# Patient Record
Sex: Male | Born: 1970 | Race: White | Hispanic: No | Marital: Married | Smoking: Never smoker
Health system: Southern US, Community
[De-identification: ages and names within clinical notes are randomized; demographics above are authoritative.]

## PROBLEM LIST (undated history)

## (undated) DIAGNOSIS — I10 Essential (primary) hypertension: Secondary | ICD-10-CM

---

## 2000-12-26 ENCOUNTER — Emergency Department (HOSPITAL_COMMUNITY): Admission: EM | Admit: 2000-12-26 | Discharge: 2000-12-26 | Payer: Self-pay | Admitting: Emergency Medicine

## 2008-01-29 ENCOUNTER — Emergency Department (HOSPITAL_COMMUNITY): Admission: EM | Admit: 2008-01-29 | Discharge: 2008-01-29 | Payer: Self-pay | Admitting: Emergency Medicine

## 2008-02-03 ENCOUNTER — Ambulatory Visit: Payer: Self-pay | Admitting: Cardiology

## 2009-03-12 IMAGING — CR DG CHEST 2V
2 series · 2 of 2 positions shown · non-contrast
Comparison: none

CLINICAL DATA: Left-sided chest pain.
 CHEST - 2 VIEW:

[w chest pa *]
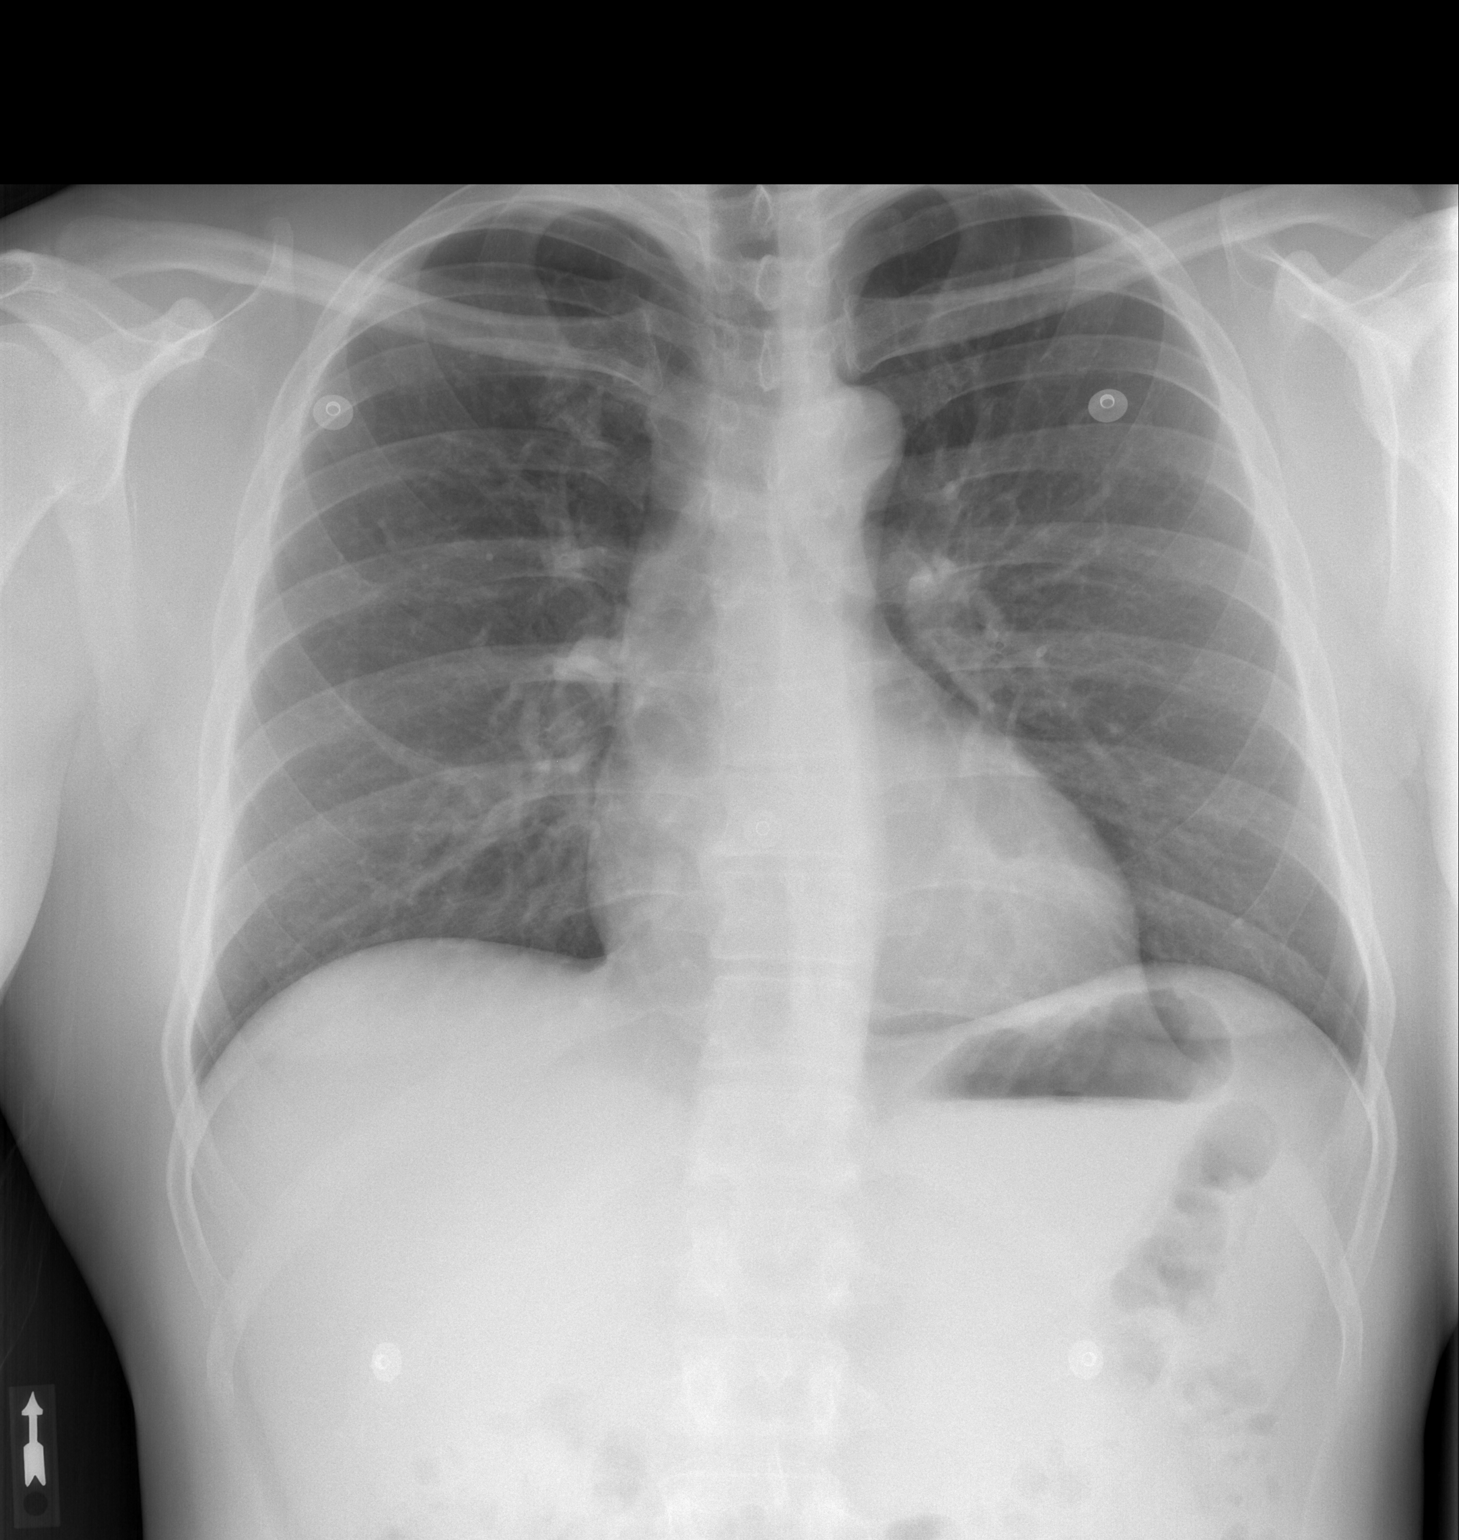

[w chest lat *]
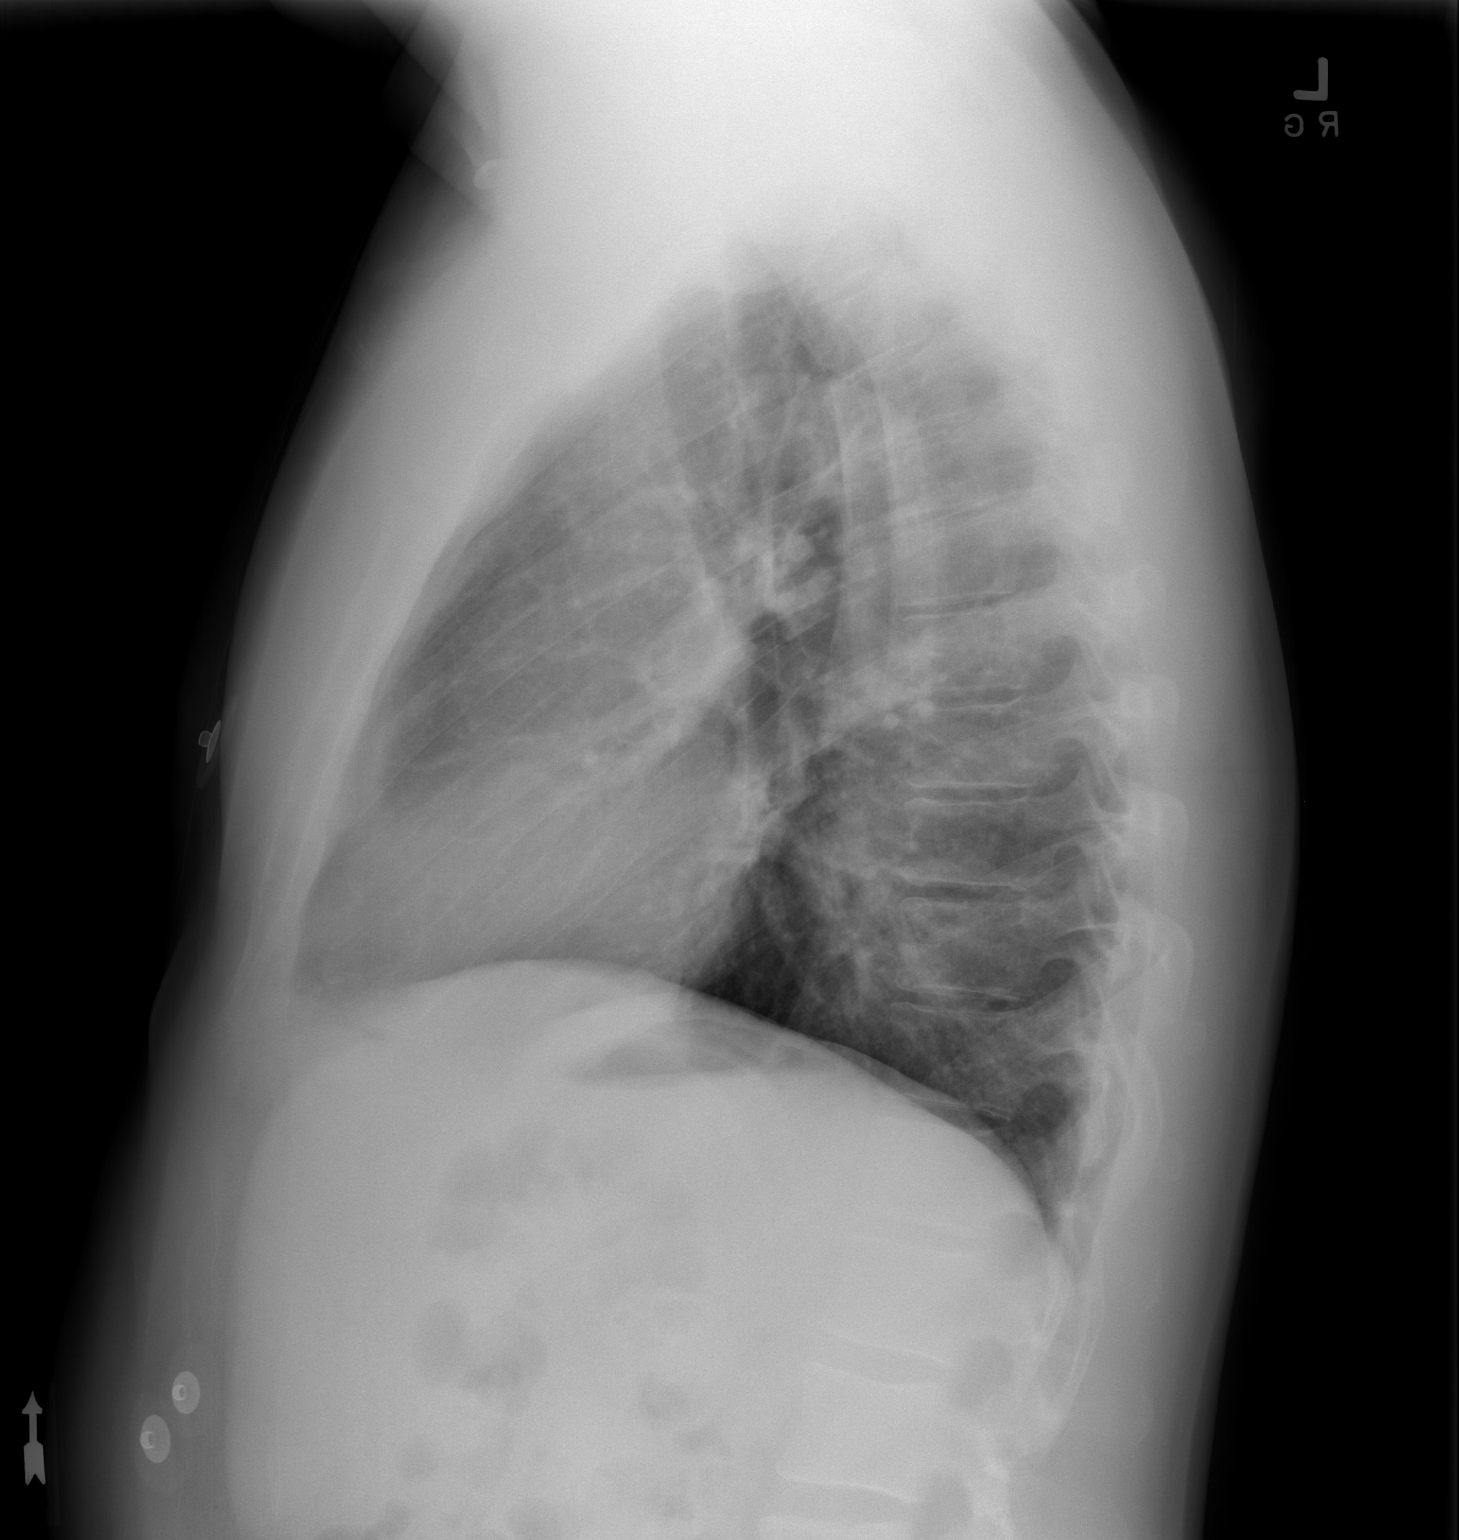

[2 of 2 positions shown; findings below may reference images not displayed]

FINDINGS: The heart size and mediastinal contours are within normal limits.  Both lungs are clear.  The visualized skeletal structures are unremarkable.
IMPRESSION: No active cardiopulmonary disease.

## 2011-05-12 NOTE — Assessment & Plan Note (Signed)
[facility]                            CARDIOLOGY OFFICE NOTE   NAME:Brownfield, OMER                          MRN:          [id number]  DATE:02/03/2008                            DOB:          13-Mar-1971    PRIMARY:  Dr. Lorre Nick.   REASON FOR PRESENTATION:  The patient with chest pain.   HISTORY OF PRESENT ILLNESS:  Patient is a  very pleasant 40 year old  white gentleman without prior cardiac history.  For 32 years, he has had  chest discomfort.  Happens about once a year.  He describes a gripping  discomfort that it is throbbing and under his left breast.  It will last  only for a few beats and then go away.  May not recur for an entire  year.  This has been a stable pattern until last Thursday.  He had the  same kind of discomfort.  It was much more intense.  It was coming and  going much more frequently.  This happened over 4 days.  He described an  intense discomfort that was throbbing.  It would last for a few to  several seconds at a time.  His wife said he looked very uncomfortable  and actually dropped what he was doing.  He did not have any  palpitations with this.  He he did not have any presyncope or syncope.  He presented to the emergency room where he was not found to have any  arrhythmias or elevated enzymes.  There were no objective findings.  He  was referred here.  It seems to be easing just a little bit.  He is  quite active.  He exercises vigorously.  He cannot bring these on.  He  does not have any shortness of breath, PND or orthopnea.  He has no  radiation to his jaw or to his arms.  It goes away spontaneously.  Happens without provocation..  It is not positional.  It has nothing to  do with inspiration.   PAST MEDICAL HISTORY:  He has no history of hypertension, diabetes or  hyperlipidemia.   PAST SURGICAL HISTORY:  None.   ALLERGIES:  None.   MEDICATIONS:  None.   SOCIAL HISTORY:  Does not smoke cigarettes and  never has.  He  occasionally drinks alcohol but rarely.  He is an Investment banker, corporate.  Married.  Has no children.  He does have horses.   FAMILY HISTORY:  Is contributory for hypertension but no palpitations,  heart failure, coronary disease early.   REVIEW OF SYSTEMS:  As stated in HPI and otherwise negative  for all  other systems.   PHYSICAL EXAMINATION:  The patient is in no distress.  Blood pressure  146/97, heart rate 71 and regular, weight 174 pounds, body mass index  27.  HEENT:  Eyelids unremarkable, pupils equal, round, reactive to light,  fundi not visualized, oral mucosa unremarkable.  NECK:  No jugular venous distention at 45 degrees.  Carotid upstroke  brisk and symmetrical.  No bruits, thyromegaly.  LYMPHATICS:  No cervical, axillary, inguinal adenopathy.  LUNGS:  Clear to auscultation bilaterally.  BACK:  No costovertebral angle tenderness..  CHEST:  Unremarkable.  HEART:  PMI not displaced or sustained, S1-S2 within normal limits.  No  S3-S4, no clicks, rubs, murmurs.  ABDOMEN:  Flat, positive bowel sounds normal in frequency, pitch, no  bruits, rebound, guarding or midline pulsatile mass, no  hepatosplenomegaly.  SKIN:  No rashes  EXTREMITIES:  2+ pulses throughout, no edema, cyanosis, clubbing.  NEURO:  Oriented to person, place, time, cranial nerves II-XII grossly  intact, motor grossly intact.   EKG:  Sinus rhythm, rate 78, axis within normal limits.  Intervals  within normal limits, no acute ST wave change.   ASSESSMENT/PLAN:  1. Chest discomfort.  The patient's chest discomfort is atypical.  I      do not think it is obstructive coronary disease.  I did discuss      with him the possibility this could be an arrhythmia.  He is going      to get a 24-hour Holter monitor.  Also check a TSH, BMET, and      magnesium.  2. Hypertension.  Blood pressure is slightly elevated.  We discussed      therapeutic lifestyle changes.  He is going to get a blood  pressure      cuff and bring it here to correlate it and  keep a blood pressure      diary.  He may need treatment for this.  3. The weight. His body mass index puts him overweight but his      physical exam demonstrates excellent fitness.  I would not worry      about the body mass index in this situation.  4. Follow up. I will seem him back in a couple weeks.Rollene Rotunda, MD, [facility]  Electronically Signed    JH/MedQ  DD: 02/03/2008  DT: 02/05/2008  Job #: [id number]

## 2011-09-18 LAB — POCT CARDIAC MARKERS
CKMB, poc: <1 — ABNORMAL LOW
Myoglobin, poc: 40.9
Operator id: 4074
Troponin i, poc: <0.05

## 2018-06-13 ENCOUNTER — Encounter (HOSPITAL_BASED_OUTPATIENT_CLINIC_OR_DEPARTMENT_OTHER): Payer: Self-pay | Admitting: Emergency Medicine

## 2018-06-13 ENCOUNTER — Other Ambulatory Visit: Payer: Self-pay

## 2018-06-13 ENCOUNTER — Emergency Department (HOSPITAL_BASED_OUTPATIENT_CLINIC_OR_DEPARTMENT_OTHER)
Admission: EM | Admit: 2018-06-13 | Discharge: 2018-06-14 | Disposition: A | Discharge status: Home / Self Care | Payer: BC Managed Care – PPO | Attending: Physician Assistant | Admitting: Physician Assistant

## 2018-06-13 DIAGNOSIS — I10 Essential (primary) hypertension: Secondary | ICD-10-CM | POA: Insufficient documentation

## 2018-06-13 DIAGNOSIS — Z79899 Other long term (current) drug therapy: Secondary | ICD-10-CM | POA: Diagnosis not present

## 2018-06-13 DIAGNOSIS — T782XXA Anaphylactic shock, unspecified, initial encounter: Secondary | ICD-10-CM | POA: Insufficient documentation

## 2018-06-13 DIAGNOSIS — T7840XA Allergy, unspecified, initial encounter: Secondary | ICD-10-CM | POA: Diagnosis present

## 2018-06-13 DIAGNOSIS — T63461A Toxic effect of venom of wasps, accidental (unintentional), initial encounter: Secondary | ICD-10-CM | POA: Insufficient documentation

## 2018-06-13 HISTORY — DX: Essential (primary) hypertension: I10

## 2018-06-13 MED ORDER — EPINEPHRINE 0.3 MG/0.3ML IJ SOAJ
INTRAMUSCULAR | Status: AC
  Administered 2018-06-13: 0.3 mg via INTRAMUSCULAR
  Filled 2018-06-13: qty 0.3

## 2018-06-13 MED ORDER — METHYLPREDNISOLONE SODIUM SUCC 125 MG IJ SOLR
125.0000 mg | Freq: Once | INTRAMUSCULAR | Status: AC
  Administered 2018-06-13: 125 mg via INTRAVENOUS

## 2018-06-13 MED ORDER — EPINEPHRINE 0.3 MG/0.3ML IJ SOAJ
0.3000 mg | Freq: Once | INTRAMUSCULAR | Status: AC
  Administered 2018-06-13: 0.3 mg via INTRAMUSCULAR

## 2018-06-13 MED ORDER — DIPHENHYDRAMINE HCL 50 MG/ML IJ SOLN
INTRAMUSCULAR | Status: AC
  Administered 2018-06-13: 25 mg via INTRAVENOUS
  Filled 2018-06-13: qty 1

## 2018-06-13 MED ORDER — METHYLPREDNISOLONE SODIUM SUCC 125 MG IJ SOLR
INTRAMUSCULAR | Status: AC
  Administered 2018-06-13: 125 mg via INTRAVENOUS
  Filled 2018-06-13: qty 2

## 2018-06-13 MED ORDER — DIPHENHYDRAMINE HCL 50 MG/ML IJ SOLN
25.0000 mg | Freq: Once | INTRAMUSCULAR | Status: AC
  Administered 2018-06-13: 25 mg via INTRAVENOUS

## 2018-06-13 MED ORDER — PREDNISONE 10 MG PO TABS
40.0000 mg | ORAL_TABLET | Freq: Every day | ORAL | 0 refills | Status: AC
Start: 2018-06-13 — End: 2018-06-17

## 2018-06-13 MED ORDER — FAMOTIDINE IN NACL 20-0.9 MG/50ML-% IV SOLN
INTRAVENOUS | Status: AC
  Administered 2018-06-13: 20 mg via INTRAVENOUS
  Filled 2018-06-13: qty 50

## 2018-06-13 MED ORDER — FAMOTIDINE IN NACL 20-0.9 MG/50ML-% IV SOLN
20.0000 mg | Freq: Once | INTRAVENOUS | Status: AC
Start: 2018-06-13 — End: 2018-06-13
  Administered 2018-06-13: 20 mg via INTRAVENOUS

## 2018-06-13 MED ORDER — EPINEPHRINE 0.3 MG/0.3ML IJ SOAJ: 0.3000 mg | Freq: Once | INTRAMUSCULAR | 0 refills | Status: AC

## 2018-06-13 NOTE — ED Triage Notes (Signed)
Patient was stung by a brown hornet around 545 pm to his left calf - patient states that he started to have extreme itching and feeling like he was going to pass out. The patient reports that his lips are numb - denies any trouble swallowing or SOB

## 2018-06-13 NOTE — Discharge Instructions (Addendum)
We will need to carry this EpiPen with you at all times.  Please follow-up with your primary care, you may need to get allergy testing.

## 2018-06-13 NOTE — ED Provider Notes (Signed)
[facility] EMERGENCY DEPARTMENT Provider Note   CSN: [id number] Arrival date & time: 06/13/18  1839     History   Chief Complaint Chief Complaint  Patient presents with  . Allergic Reaction    HPI Aydrien Froman is a 47 y.o. male.  HPI   Patient is a 47 year old male presenting after a wasp sting.  Patient had a wasp sting at 6:30 PM.  Patient reports she started having itchiness.  Went home and his wife gave him 25 mg of Benadryl.  Patient felt tingling in his lips.  Patient almost passed out.  Patient's wife took his blood pressure and noticed it to be hypotensive.  Patient then came to the emergency department.  He reports that his tingling in his lips have not gotten any worse nor any better.  He reports the itching has gotten mildly better.  Patient is never had allergic reaction in the past.  Past Medical History:  Diagnosis Date  . Hypertension     There are no active problems to display for this patient.   History reviewed. No pertinent surgical history.      Home Medications    Prior to Admission medications   Medication Sig Start Date End Date Taking? Authorizing Provider  amLODipine (NORVASC) 10 MG tablet Take 10 mg by mouth daily.   Yes [provider]  losartan (COZAAR) 50 MG tablet Take 50 mg by mouth daily.   Yes [provider]    Family History History reviewed. No pertinent family history.  Social History Social History   Tobacco Use  . Smoking status: Never Smoker  . Smokeless tobacco: Never Used  Substance Use Topics  . Alcohol use: Never    Frequency: Never  . Drug use: Never     Allergies   Patient has no known allergies.   Review of Systems Review of Systems  Constitutional: Negative for activity change.  Respiratory: Negative for shortness of breath.   Cardiovascular: Negative for chest pain.  Gastrointestinal: Negative for abdominal pain.  Skin: Positive for rash.  All other systems reviewed and  are negative.    Physical Exam Updated Vital Signs BP 138/81 (BP Location: Left Arm)   Pulse (!) 58   Temp (!) 97 F (36.1 C) (Oral)   Resp 18   Ht 5\' 6"  (1.676 m)   Wt 79.4 kg (175 lb)   SpO2 100%   BMI 28.25 kg/m   Physical Exam  Constitutional: He is oriented to person, place, and time. He appears well-nourished.  HENT:  Head: Normocephalic.  No uvula swelling,  Eyes: Conjunctivae are normal.  Cardiovascular: Normal rate and regular rhythm.  Pulmonary/Chest: Effort normal and breath sounds normal. No respiratory distress.  Abdominal: Soft. He exhibits no distension. There is no tenderness.  Neurological: He is oriented to person, place, and time.  Skin: Skin is warm and dry. He is not diaphoretic.  Hive-like rash bilateral upper extremities and chest.  Psychiatric: He has a normal mood and affect. His behavior is normal.     ED Treatments / Results  Labs (all labs ordered are listed, but only abnormal results are displayed) Labs Reviewed - No data to display  EKG None  Radiology No results found.  Procedures Procedures (including critical care time)  CRITICAL CARE Performed by: Arlana Hove Total critical care time: 45 minutes Critical care time was exclusive of separately billable procedures and treating other patients. Critical care was necessary to treat or prevent imminent or  life-threatening deterioration. Critical care was time spent personally by me on the following activities: development of treatment plan with patient and/or surrogate as well as nursing, discussions with consultants, evaluation of patient's response to treatment, examination of patient, obtaining history from patient or surrogate, ordering and performing treatments and interventions, ordering and review of laboratory studies, ordering and review of radiographic studies, pulse oximetry and re-evaluation of patient's condition.   Medications Ordered in ED Medications    EPINEPHrine (EPI-PEN) injection 0.3 mg (has no administration in time range)  diphenhydrAMINE (BENADRYL) injection 25 mg (has no administration in time range)  famotidine (PEPCID) IVPB 20 mg premix (has no administration in time range)  methylPREDNISolone sodium succinate (SOLU-MEDROL) 125 mg/2 mL injection 125 mg (has no administration in time range)     Initial Impression / Assessment and Plan / ED Course  I have reviewed the triage vital signs and the nursing notes.  Pertinent labs & imaging results that were available during my care of the patient were reviewed by me and considered in my medical decision making (see chart for details).      Patient is a 47 year old male presenting after a wasp sting.  Patient had a wasp sting at 6:30 PM.  Patient reports she started having itchiness.  Went home and his wife gave him 25 mg of Benadryl.  Patient felt tingling in his lips.  Patient almost passed out.  Patient's wife took his blood pressure and noticed it to be hypotensive.  Patient then came to the emergency department.  He reports that his tingling in his lips have not gotten any worse nor any better.  He reports the itching has gotten mildly better.  Patient is never had allergic reaction in the past.  7:04 PM Will give epi, steroids, benadryl, pecid  Symptoms improved.  12:00 AM Symptoms still resolved, will discharge with epi pen, steroid burt and follow up with PCP.   Final Clinical Impressions(s) / ED Diagnoses   Final diagnoses:  None    ED Discharge Orders    None       Shataria Crist Lyn, MD 06/14/18 0000

## 2018-06-13 NOTE — ED Notes (Signed)
Pt on monitor 

## 2020-04-10 ENCOUNTER — Ambulatory Visit (INDEPENDENT_AMBULATORY_CARE_PROVIDER_SITE_OTHER): Payer: BC Managed Care – PPO

## 2020-04-10 ENCOUNTER — Other Ambulatory Visit: Payer: Self-pay

## 2020-04-10 ENCOUNTER — Ambulatory Visit: Payer: BC Managed Care – PPO | Admitting: Podiatry

## 2020-04-10 VITALS — Temp 97.9°F

## 2020-04-10 DIAGNOSIS — M7751 Other enthesopathy of right foot: Secondary | ICD-10-CM

## 2020-04-10 DIAGNOSIS — M778 Other enthesopathies, not elsewhere classified: Secondary | ICD-10-CM

## 2020-04-10 DIAGNOSIS — M79671 Pain in right foot: Secondary | ICD-10-CM

## 2020-04-10 MED ORDER — DICLOFENAC SODIUM 75 MG PO TBEC: 75.0000 mg | DELAYED_RELEASE_TABLET | Freq: Two times a day (BID) | ORAL | 1 refills | Status: DC

## 2020-04-11 ENCOUNTER — Telehealth: Payer: Self-pay | Admitting: *Deleted

## 2020-04-11 DIAGNOSIS — S92901G Unspecified fracture of right foot, subsequent encounter for fracture with delayed healing: Secondary | ICD-10-CM

## 2020-04-11 NOTE — Telephone Encounter (Signed)
-----   Message from Felecia Shelling, DPM sent at 04/10/2020  3:26 PM EDT ----- Regarding: MRI RT foot Please order MRI RT foot w/out contrast.   Dx: chronic foot pain RT x 9 mos. Possible stress fx RT. Tendinitis RT.   Thanks, Dr. Logan Bores

## 2020-04-11 NOTE — Telephone Encounter (Signed)
Faxed orders to [facility], given to H. Zonia Kief, CMA for pre-cert.

## 2020-04-16 ENCOUNTER — Other Ambulatory Visit: Payer: Self-pay | Admitting: Podiatry

## 2020-04-16 DIAGNOSIS — M778 Other enthesopathies, not elsewhere classified: Secondary | ICD-10-CM

## 2020-04-17 NOTE — Progress Notes (Signed)
   HPI: 49 y.o. male presenting today as a new patient with a chief complaint of right medial midfoot and heel pain that began about 9 months ago. He has been diagnosed with plantar fasciitis in the past and has received injections with minimal relief. Working and running increases the pain. He has also taken Ibuprofen, done heel stretches and used Aspercreme with minimal relief. He has been using an ankle brace as well. Patient is here for further evaluation and treatment.   Past Medical History:  Diagnosis Date  . Hypertension      Physical Exam: General: The patient is alert and oriented x3 in no acute distress.  Dermatology: Skin is warm, dry and supple bilateral lower extremities. Negative for open lesions or macerations.  Vascular: Palpable pedal pulses bilaterally. No edema or erythema noted. Capillary refill within normal limits.  Neurological: Epicritic and protective threshold grossly intact bilaterally.   Musculoskeletal Exam: Pain with palpation noted to the right dorsal foot. Range of motion within normal limits to all pedal and ankle joints bilateral. Muscle strength 5/5 in all groups bilateral.   Radiographic Exam:  Normal osseous mineralization. Joint spaces preserved. No fracture/dislocation/boney destruction.    Assessment: 1. Right midfoot capsulitis / possible fracture    Plan of Care:  1. Patient evaluated. X-Rays reviewed.  2. MRI of the right foot ordered. Patient has failed conservative treatment for 9 months.  3. Prescription for Diclofenac provided to patient. 4. Return to clinic in 3 weeks to review MRI.   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      Felecia Shelling, DPM [facility]  Dr. Felecia Shelling, DPM    [address] [place], [place] [address]                Office [phone]  Fax [phone]

## 2020-05-06 ENCOUNTER — Other Ambulatory Visit: Payer: Self-pay | Admitting: Podiatry

## 2020-05-10 ENCOUNTER — Other Ambulatory Visit: Payer: Self-pay

## 2020-05-10 ENCOUNTER — Ambulatory Visit
Admission: RE | Admit: 2020-05-10 | Discharge: 2020-05-10 | Disposition: A | Discharge status: Home / Self Care | Payer: BC Managed Care – PPO | Source: Ambulatory Visit | Attending: Podiatry | Admitting: Podiatry

## 2020-05-10 DIAGNOSIS — S92901G Unspecified fracture of right foot, subsequent encounter for fracture with delayed healing: Secondary | ICD-10-CM

## 2020-05-15 ENCOUNTER — Ambulatory Visit: Payer: BC Managed Care – PPO | Admitting: Podiatry

## 2020-05-15 ENCOUNTER — Encounter: Payer: Self-pay | Admitting: Podiatry

## 2020-05-15 ENCOUNTER — Other Ambulatory Visit: Payer: Self-pay

## 2020-05-15 DIAGNOSIS — R937 Abnormal findings on diagnostic imaging of other parts of musculoskeletal system: Secondary | ICD-10-CM

## 2020-05-15 DIAGNOSIS — M778 Other enthesopathies, not elsewhere classified: Secondary | ICD-10-CM | POA: Diagnosis not present

## 2020-05-15 MED ORDER — DICLOFENAC SODIUM 75 MG PO TBEC: 75.0000 mg | DELAYED_RELEASE_TABLET | Freq: Two times a day (BID) | ORAL | 3 refills | Status: DC

## 2020-05-16 ENCOUNTER — Encounter: Payer: Self-pay | Admitting: Podiatry

## 2020-05-19 NOTE — Progress Notes (Signed)
   HPI: 49 y.o. male presenting today for follow up evaluation of right foot pain. He reports some continued soreness of the foot. He has been taking Diclofenac as directed. He expresses some concern over using the CAM boot. There are no specific worsening factors noted. Patient is here for further evaluation and treatment.      Past Medical History:  Diagnosis Date  . Hypertension      Physical Exam: General: The patient is alert and oriented x3 in no acute distress.  Dermatology: Skin is warm, dry and supple bilateral lower extremities. Negative for open lesions or macerations.  Vascular: Palpable pedal pulses bilaterally. No edema or erythema noted. Capillary refill within normal limits.  Neurological: Epicritic and protective threshold grossly intact bilaterally.   Musculoskeletal Exam: Pain with palpation noted to the right dorsal foot. Range of motion within normal limits to all pedal and ankle joints bilateral. Muscle strength 5/5 in all groups bilateral.   MRI Impression done on 05/10/2020:  1. Plantar fasciitis with high-grade partial tear. 2. Suspect short-segment split tear of the inframalleolar aspect of the peroneus brevis tendon. 3. Mild patchy bone marrow edema within the medial navicular and proximal aspect of the medial cuneiform, which may reflect stress related changes/altered mechanics versus degenerative changes.  Assessment: 1. Right midfoot capsulitis    Plan of Care:  1. Patient evaluated. MRI reviewed.  2. Injection of 0.5 mLs Celestone Soluspan injected into the right medial midfoot.  3. Refill prescription for Diclofenac provided to patient.  4. OTC insoles provided to patient.  5. Recommended shoes from [employer].  6. Return to clinic in 4 weeks.   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      Felecia Shelling, DPM [facility]  Dr. Felecia Shelling, DPM    [address] [place], [place] [address]                Office [phone]  Fax [phone]

## 2020-06-24 ENCOUNTER — Ambulatory Visit: Payer: BC Managed Care – PPO | Admitting: Podiatry

## 2020-07-02 ENCOUNTER — Encounter: Payer: Self-pay | Admitting: Podiatry

## 2020-07-08 ENCOUNTER — Ambulatory Visit: Payer: BC Managed Care – PPO | Admitting: Podiatry

## 2021-01-15 ENCOUNTER — Encounter: Payer: Self-pay | Admitting: Podiatry

## 2021-01-20 ENCOUNTER — Other Ambulatory Visit: Payer: Self-pay

## 2021-01-20 ENCOUNTER — Ambulatory Visit: Payer: BC Managed Care – PPO | Admitting: Podiatry

## 2021-01-20 DIAGNOSIS — M76821 Posterior tibial tendinitis, right leg: Secondary | ICD-10-CM | POA: Diagnosis not present

## 2021-01-20 MED ORDER — METHYLPREDNISOLONE 4 MG PO TBPK: ORAL_TABLET | ORAL | 0 refills | Status: DC

## 2021-02-03 DIAGNOSIS — M76821 Posterior tibial tendinitis, right leg: Secondary | ICD-10-CM | POA: Diagnosis not present

## 2021-02-03 MED ORDER — BETAMETHASONE SOD PHOS & ACET 6 (3-3) MG/ML IJ SUSP
3.0000 mg | Freq: Once | INTRAMUSCULAR | Status: AC
  Administered 2021-02-03: 3 mg via INTRA_ARTICULAR

## 2021-02-03 NOTE — Progress Notes (Signed)
   HPI: 50 y.o. male presenting today for new complaint regarding pain and tenderness that developed to the patient's right foot.  Patient states that he cannot take the diclofenac due to GI complications and upset stomach.  He was last seen in the office on 05/15/2020.  Patient states that most recently he has had a flareup of pain that is been exacerbated over the past month or 2.  He presents for further treatment and evaluation      Past Medical History:  Diagnosis Date  . Hypertension      Physical Exam: General: The patient is alert and oriented x3 in no acute distress.  Dermatology: Skin is warm, dry and supple bilateral lower extremities. Negative for open lesions or macerations.  Vascular: Palpable pedal pulses bilaterally. No edema or erythema noted. Capillary refill within normal limits.  Neurological: Epicritic and protective threshold grossly intact bilaterally.   Musculoskeletal Exam: Pain with palpation noted to the right dorsal foot. Range of motion within normal limits to all pedal and ankle joints bilateral. Muscle strength 5/5 in all groups bilateral.   MRI Impression done on 05/10/2020:  1. Plantar fasciitis with high-grade partial tear. 2. Suspect short-segment split tear of the inframalleolar aspect of the peroneus brevis tendon. 3. Mild patchy bone marrow edema within the medial navicular and proximal aspect of the medial cuneiform, which may reflect stress related changes/altered mechanics versus degenerative changes.  Assessment: 1.  Posterior tibial tendinitis right   Plan of Care:  1. Patient evaluated. MRI reviewed.  2. Injection of 0.5 mLs Celestone Soluspan injected into the right posterior tibial tendon sheath 3.  Prescription for Medrol Dosepak, then resume OTC Motrin as needed 4.  Continue OTC insoles/arch supports with good supportive shoes 5.  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      Felecia Shelling, DPM [facility]  Dr. Felecia Shelling, DPM    [address] [place], [place] [address]                Office [phone]  Fax [phone]

## 2021-10-19 ENCOUNTER — Encounter: Payer: Self-pay | Admitting: Podiatry

## 2021-10-20 ENCOUNTER — Other Ambulatory Visit: Payer: Self-pay | Admitting: Podiatry

## 2021-10-20 MED ORDER — DICLOFENAC SODIUM 75 MG PO TBEC: 75.0000 mg | DELAYED_RELEASE_TABLET | Freq: Two times a day (BID) | ORAL | 3 refills | Status: DC

## 2021-10-21 ENCOUNTER — Telehealth: Payer: Self-pay | Admitting: Podiatry

## 2021-10-21 ENCOUNTER — Other Ambulatory Visit: Payer: Self-pay | Admitting: Podiatry

## 2021-10-21 MED ORDER — DICLOFENAC SODIUM 75 MG PO TBEC: 75.0000 mg | DELAYED_RELEASE_TABLET | Freq: Two times a day (BID) | ORAL | 3 refills | Status: DC

## 2021-10-21 MED ORDER — METHYLPREDNISOLONE 4 MG PO TBPK: ORAL_TABLET | ORAL | 0 refills | Status: DC

## 2021-10-21 NOTE — Telephone Encounter (Signed)
Patient called to notify office prescription was sent to old pharmacy. He would like that to be resent to current pharmacy on file - [facility] [address]. Thanks

## 2021-10-21 NOTE — Telephone Encounter (Signed)
Prescription sent.  Thanks, Dr. Alexa Golebiewski

## 2022-08-07 ENCOUNTER — Other Ambulatory Visit: Payer: Self-pay | Admitting: Podiatry

## 2022-08-07 ENCOUNTER — Telehealth: Payer: Self-pay | Admitting: Podiatry

## 2022-08-07 MED ORDER — DICLOFENAC SODIUM 75 MG PO TBEC: 75.0000 mg | DELAYED_RELEASE_TABLET | Freq: Two times a day (BID) | ORAL | 3 refills | Status: DC

## 2022-08-07 NOTE — Telephone Encounter (Signed)
Medication refill  diclofenac (VOLTAREN) 75 MG EC tablet  [id number] [place], [place] - [address]  [address], [place]  [address]

## 2022-08-07 NOTE — Telephone Encounter (Signed)
Refill sent. - Dr. Marvelyn Bouchillon

## 2022-10-05 ENCOUNTER — Other Ambulatory Visit: Payer: Self-pay | Admitting: Podiatry

## 2022-10-23 ENCOUNTER — Other Ambulatory Visit: Payer: Self-pay | Admitting: Podiatry

## 2022-11-27 ENCOUNTER — Other Ambulatory Visit: Payer: Self-pay | Admitting: Podiatry

## 2022-12-09 ENCOUNTER — Encounter: Payer: Self-pay | Admitting: Podiatry

## 2022-12-09 ENCOUNTER — Ambulatory Visit: Payer: BC Managed Care – PPO | Admitting: Podiatry

## 2022-12-09 VITALS — BP 133/66 | HR 76

## 2022-12-09 DIAGNOSIS — M76821 Posterior tibial tendinitis, right leg: Secondary | ICD-10-CM

## 2022-12-09 MED ORDER — BETAMETHASONE SOD PHOS & ACET 6 (3-3) MG/ML IJ SUSP: 3.0000 mg | Freq: Once | INTRAMUSCULAR | Status: AC

## 2022-12-09 NOTE — Progress Notes (Signed)
   Chief Complaint  Patient presents with   Ankle Pain    Patient is here for right ankle pain and want injection.    HPI: 51 y.o. male presenting today for recurrence of pain and tenderness to the medial aspect of the right foot.  Patient last seen in the office 01/20/2021.  Gradual recurrence of the symptoms.  Patient states that wintertime makes his pain more prevalent.  Requesting injection today.      Past Medical History:  Diagnosis Date   Hypertension      Physical Exam: General: The patient is alert and oriented x3 in no acute distress.  Dermatology: Skin is warm, dry and supple bilateral lower extremities. Negative for open lesions or macerations.  Vascular: Palpable pedal pulses bilaterally. No edema or erythema noted. Capillary refill within normal limits.  Neurological: Epicritic and protective threshold grossly intact bilaterally.   Musculoskeletal Exam: Pain with palpation noted to the right dorsal foot. Range of motion within normal limits to all pedal and ankle joints bilateral. Muscle strength 5/5 in all groups bilateral.   MRI Impression done on 05/10/2020:  1. Plantar fasciitis with high-grade partial tear. 2. Suspect short-segment split tear of the inframalleolar aspect of the peroneus brevis tendon. 3. Mild patchy bone marrow edema within the medial navicular and proximal aspect of the medial cuneiform, which may reflect stress related changes/altered mechanics versus degenerative changes.  Assessment: 1.  Posterior tibial tendinitis right   Plan of Care:  1. Patient evaluated. MRI reviewed.  2. Injection of 0.5 mLs Celestone Soluspan injected into the right posterior tibial tendon sheath 3.  Prescription for Medrol Dosepak, then resume OTC Motrin as needed 4.  Continue OTC insoles/arch supports with good supportive shoes 5.  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      Felecia Shelling, DPM [facility]  Dr. Felecia Shelling, DPM    [address] [place], [place] [address]                Office [phone]  Fax [phone]

## 2022-12-10 ENCOUNTER — Other Ambulatory Visit: Payer: Self-pay | Admitting: Podiatry

## 2022-12-10 NOTE — Telephone Encounter (Signed)
Medication was not sent to pharmacy.   Dose pack   [id number] - [place], [place] - [address], [place] [address]      Please advise 

## 2022-12-11 ENCOUNTER — Telehealth: Payer: Self-pay | Admitting: Podiatry

## 2022-12-11 MED ORDER — METHYLPREDNISOLONE 4 MG PO TBPK: ORAL_TABLET | ORAL | 0 refills | Status: AC

## 2022-12-11 NOTE — Telephone Encounter (Signed)
Please advise 

## 2022-12-11 NOTE — Telephone Encounter (Signed)
Pt is calling back for medication refill.  Please advise

## 2022-12-11 NOTE — Telephone Encounter (Signed)
Prescription sent.  Please notify patient.  Sorry for the delay.  Thanks, Dr. Logan Bores

## 2022-12-11 NOTE — Telephone Encounter (Signed)
Medication was not sent to pharmacy.   Dose pack   [id number] [place], [place] - [address], [place] [address]      Please advise

## 2022-12-11 NOTE — Addendum Note (Signed)
Addended by: Felecia Shelling on: 12/11/2022 02:31 PM   Modules accepted: Orders

## 2022-12-15 ENCOUNTER — Telehealth: Payer: Self-pay | Admitting: *Deleted

## 2022-12-15 NOTE — Telephone Encounter (Signed)
Patient updated thru voice message that medication has been sent to pharmacy.

## 2023-04-14 ENCOUNTER — Other Ambulatory Visit: Payer: Self-pay | Admitting: Podiatry

## 2024-02-16 ENCOUNTER — Other Ambulatory Visit: Payer: Self-pay | Admitting: Podiatry

## 2024-10-17 ENCOUNTER — Other Ambulatory Visit: Payer: Self-pay | Admitting: Podiatry
